# Patient Record
Sex: Male | Born: 1990 | Race: White | Hispanic: No | Marital: Single | Smoking: Current every day smoker
Health system: Southern US, Community
[De-identification: ages and names within clinical notes are randomized; demographics above are authoritative.]

## PROBLEM LIST (undated history)

## (undated) DIAGNOSIS — J45909 Unspecified asthma, uncomplicated: Secondary | ICD-10-CM

## (undated) HISTORY — PX: NO PAST SURGERIES: SHX2092

---

## 2011-02-26 ENCOUNTER — Emergency Department: Payer: Self-pay | Admitting: Emergency Medicine

## 2011-11-20 ENCOUNTER — Ambulatory Visit (INDEPENDENT_AMBULATORY_CARE_PROVIDER_SITE_OTHER): Payer: 59 | Admitting: Physician Assistant

## 2011-11-20 DIAGNOSIS — Z0000 Encounter for general adult medical examination without abnormal findings: Secondary | ICD-10-CM

## 2011-11-20 DIAGNOSIS — Z7251 High risk heterosexual behavior: Secondary | ICD-10-CM

## 2011-11-20 LAB — POCT CBC
Granulocyte percent: 52.4 %G (ref 37–80)
HCT, POC: 48.5 % (ref 43.5–53.7)
MCH, POC: 29.4 pg (ref 27–31.2)
MCV: 90.4 fL (ref 80–97)
MID (cbc): 0.4 (ref 0–0.9)
Platelet Count, POC: 177 10*3/uL (ref 142–424)
RBC: 5.37 M/uL (ref 4.69–6.13)
WBC: 7.3 10*3/uL (ref 4.6–10.2)

## 2011-11-20 LAB — POCT URINALYSIS DIPSTICK
Glucose, UA: NEGATIVE
Nitrite, UA: NEGATIVE
Protein, UA: NEGATIVE
Spec Grav, UA: 1.015
Urobilinogen, UA: 1

## 2011-11-20 NOTE — Patient Instructions (Signed)
Healthy lifestyle 

## 2011-11-20 NOTE — Progress Notes (Signed)
  Subjective:    Patient ID: Drew Brown, male    DOB: 14-Jan-1991, 20 y.o.   MRN: [id number]  HPI Here for CPE.  Needs labs for work insurance.  No problems.  Would like STD testing.   Review of Systems  Constitutional: Negative.   HENT: Negative.   Eyes: Negative.   Respiratory: Negative.   Cardiovascular: Negative.   Gastrointestinal: Negative.   Genitourinary: Negative.   Musculoskeletal: Negative.   Neurological: Negative.   Hematological: Negative.   Psychiatric/Behavioral: Negative.        Objective:   Physical Exam  Constitutional: He is oriented to person, place, and time. He appears well-developed and well-nourished.  HENT:  Head: Normocephalic and atraumatic.  Right Ear: External ear normal.  Left Ear: External ear normal.  Nose: Nose normal.  Mouth/Throat: Oropharynx is clear and moist.  Eyes: Conjunctivae and EOM are normal. Pupils are equal, round, and reactive to light.  Neck: Normal range of motion. Neck supple. No tracheal deviation present. No thyromegaly present.  Cardiovascular: Normal rate, regular rhythm and normal heart sounds.  Exam reveals no gallop and no friction rub.   No murmur heard. Pulmonary/Chest: Effort normal and breath sounds normal. He has no wheezes.  Abdominal: Soft. Bowel sounds are normal. He exhibits no distension. There is no tenderness. Hernia confirmed negative in the right inguinal area and confirmed negative in the left inguinal area.  Genitourinary: Testes normal and penis normal. Circumcised.  Musculoskeletal: Normal range of motion.  Lymphadenopathy:    He has no cervical adenopathy.  Neurological: He is alert and oriented to person, place, and time. He has normal reflexes.  Skin: Skin is warm and dry.  Psychiatric: He has a normal mood and affect. His behavior is normal. Judgment and thought content normal.          Assessment & Plan:   1. Annual physical exam  POCT CBC, Comprehensive metabolic panel, POCT UA -  Microscopic Only, Lipid panel  2. High risk sexual behavior  GC/chlamydia probe amp, urine, HIV antibody, Hepatitis B surface antigen, Hepatitis B surface antibody, Hepatitis C antibody   Form filled out for work.  Will fax form when lipid results are in.

## 2011-11-21 LAB — COMPREHENSIVE METABOLIC PANEL
AST: 47 U/L — ABNORMAL HIGH (ref 0–37)
Albumin: 4.9 g/dL (ref 3.5–5.2)
BUN: 13 mg/dL (ref 6–23)
CO2: 23 mEq/L (ref 19–32)
Calcium: 9.5 mg/dL (ref 8.4–10.5)
Chloride: 104 mEq/L (ref 96–112)
Creat: 0.91 mg/dL (ref 0.50–1.35)
Glucose, Bld: 80 mg/dL (ref 70–99)
Potassium: 4 mEq/L (ref 3.5–5.3)

## 2011-11-21 LAB — HIV ANTIBODY (ROUTINE TESTING W REFLEX): HIV: NONREACTIVE

## 2011-11-21 LAB — HEPATITIS B SURFACE ANTIGEN: Hepatitis B Surface Ag: NEGATIVE

## 2011-11-21 LAB — LIPID PANEL
Cholesterol: 137 mg/dL (ref 0–200)
LDL Cholesterol: 87 mg/dL (ref 0–99)
Total CHOL/HDL Ratio: 4.4 Ratio
VLDL: 19 mg/dL (ref 0–40)

## 2011-11-21 LAB — RPR

## 2011-11-22 LAB — GC/CHLAMYDIA PROBE AMP, URINE
Chlamydia, Swab/Urine, PCR: NEGATIVE
GC Probe Amp, Urine: NEGATIVE

## 2013-03-31 ENCOUNTER — Ambulatory Visit (INDEPENDENT_AMBULATORY_CARE_PROVIDER_SITE_OTHER): Payer: PRIVATE HEALTH INSURANCE | Admitting: Family Medicine

## 2013-03-31 VITALS — BP 126/84 | HR 78 | Temp 98.2°F | Resp 16 | Ht 71.5 in | Wt 282.0 lb

## 2013-03-31 DIAGNOSIS — Z8481 Family history of carrier of genetic disease: Secondary | ICD-10-CM

## 2013-03-31 DIAGNOSIS — Z131 Encounter for screening for diabetes mellitus: Secondary | ICD-10-CM

## 2013-03-31 DIAGNOSIS — Z113 Encounter for screening for infections with a predominantly sexual mode of transmission: Secondary | ICD-10-CM

## 2013-03-31 DIAGNOSIS — Z83438 Family history of other disorder of lipoprotein metabolism and other lipidemia: Secondary | ICD-10-CM

## 2013-03-31 DIAGNOSIS — Z0000 Encounter for general adult medical examination without abnormal findings: Secondary | ICD-10-CM

## 2013-03-31 LAB — LIPID PANEL
Cholesterol: 127 mg/dL (ref 0–200)
HDL: 33 mg/dL — ABNORMAL LOW (ref >39)
LDL Cholesterol: 56 mg/dL (ref 0–99)
Total CHOL/HDL Ratio: 3.8 Ratio
Triglycerides: 190 mg/dL — ABNORMAL HIGH (ref <150)
VLDL: 38 mg/dL (ref 0–40)

## 2013-03-31 LAB — POCT GLYCOSYLATED HEMOGLOBIN (HGB A1C): Hemoglobin A1C: 5

## 2013-03-31 LAB — COMPREHENSIVE METABOLIC PANEL
ALT: 73 U/L — ABNORMAL HIGH (ref 0–53)
AST: 33 U/L (ref 0–37)
Alkaline Phosphatase: 66 U/L (ref 39–117)
CO2: 25 mEq/L (ref 19–32)
Creat: 0.95 mg/dL (ref 0.50–1.35)
Total Bilirubin: 0.5 mg/dL (ref 0.3–1.2)

## 2013-03-31 LAB — POCT CBC
Granulocyte percent: 64.5 %G (ref 37–80)
HCT, POC: 50.2 % (ref 43.5–53.7)
Hemoglobin: 16.4 g/dL (ref 14.1–18.1)
Lymph, poc: 2.1 (ref 0.6–3.4)
MCH, POC: 30.8 pg (ref 27–31.2)
MCHC: 32.7 g/dL (ref 31.8–35.4)
MCV: 94.4 fL (ref 80–97)
MID (cbc): 0.5 (ref 0–0.9)
MPV: 10.3 fL (ref 0–99.8)
POC Granulocyte: 4.8 (ref 2–6.9)
POC LYMPH PERCENT: 28.6 %L (ref 10–50)
POC MID %: 6.9 %M (ref 0–12)
Platelet Count, POC: 188 10*3/uL (ref 142–424)
RBC: 5.32 M/uL (ref 4.69–6.13)
RDW, POC: 12.8 %
WBC: 7.4 10*3/uL (ref 4.6–10.2)

## 2013-03-31 LAB — HIV ANTIBODY (ROUTINE TESTING W REFLEX): HIV: NONREACTIVE

## 2013-03-31 LAB — COMPREHENSIVE METABOLIC PANEL WITH GFR
Albumin: 4.8 g/dL (ref 3.5–5.2)
BUN: 7 mg/dL (ref 6–23)
Calcium: 9.3 mg/dL (ref 8.4–10.5)
Chloride: 105 meq/L (ref 96–112)
Glucose, Bld: 86 mg/dL (ref 70–99)
Potassium: 4.1 meq/L (ref 3.5–5.3)
Sodium: 142 meq/L (ref 135–145)
Total Protein: 6.8 g/dL (ref 6.0–8.3)

## 2013-03-31 LAB — TSH: TSH: 2.16 u[IU]/mL (ref 0.350–4.500)

## 2013-03-31 LAB — RPR
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
## 2013-04-01 LAB — HSV(HERPES SIMPLEX VRS) I + II AB-IGG
HSV 1 Glycoprotein G Ab, IgG: 5.31 IV — ABNORMAL HIGH
HSV 2 Glycoprotein G Ab, IgG: 0.1 IV

## 2013-04-05 ENCOUNTER — Telehealth: Payer: Self-pay

## 2013-04-05 NOTE — Telephone Encounter (Signed)
Pt saw test results on mychart and doesn't understand them. Requests a call to help him understand what they mean.   Bf

## 2013-04-06 ENCOUNTER — Telehealth: Payer: Self-pay | Admitting: Family Medicine

## 2013-04-06 NOTE — Telephone Encounter (Signed)
I spoke with Drew Brown and he wanted to know the results to his lab results. I advised him of what was showing in the system and explained how to use the [facility] access. He was able to view his labs, just did not understand the numbers. I explained to him the results, but also would like for Dr Conley Rolls to look over and see if there is anything she would like for [place] to advise. Also, awaiting G/C results. He understood and will await call back.

## 2013-04-06 NOTE — Telephone Encounter (Signed)
Spoke to pateitn regarding labs. G/C still pending

## 2013-04-07 LAB — GC/CHLAMYDIA PROBE AMP, GENITAL
Chlamydia, DNA Probe: NEGATIVE
GC Probe Amp, Genital: NEGATIVE

## 2013-08-26 ENCOUNTER — Other Ambulatory Visit: Payer: Self-pay

## 2014-09-21 ENCOUNTER — Emergency Department: Payer: Self-pay | Admitting: Emergency Medicine

## 2014-09-21 LAB — COMPREHENSIVE METABOLIC PANEL
ALK PHOS: 106 U/L
ALT: 145 U/L — AB
ANION GAP: 8 (ref 7–16)
AST: 73 U/L — AB (ref 15–37)
Albumin: 3.5 g/dL (ref 3.4–5.0)
BUN: 11 mg/dL (ref 7–18)
Bilirubin,Total: 0.5 mg/dL (ref 0.2–1.0)
CREATININE: 0.97 mg/dL (ref 0.60–1.30)
Calcium, Total: 8.1 mg/dL — ABNORMAL LOW (ref 8.5–10.1)
Chloride: 106 mmol/L (ref 98–107)
Co2: 25 mmol/L (ref 21–32)
EGFR (African American): >60
Glucose: 118 mg/dL — ABNORMAL HIGH (ref 65–99)
Osmolality: 278 (ref 275–301)
Potassium: 3.4 mmol/L — ABNORMAL LOW (ref 3.5–5.1)
SODIUM: 139 mmol/L (ref 136–145)
Total Protein: 6.9 g/dL (ref 6.4–8.2)

## 2014-09-21 LAB — CBC
HCT: 44.4 % (ref 40.0–52.0)
HGB: 14.7 g/dL (ref 13.0–18.0)
MCH: 30.2 pg (ref 26.0–34.0)
MCHC: 33.1 g/dL (ref 32.0–36.0)
MCV: 91 fL (ref 80–100)
Platelet: 173 10*3/uL (ref 150–440)
RBC: 4.87 10*6/uL (ref 4.40–5.90)
RDW: 14.1 % (ref 11.5–14.5)
WBC: 10.3 10*3/uL (ref 3.8–10.6)

## 2014-09-26 ENCOUNTER — Ambulatory Visit: Payer: Self-pay | Admitting: Hematology and Oncology

## 2014-09-26 LAB — COMPREHENSIVE METABOLIC PANEL
ALBUMIN: 3.3 g/dL — AB (ref 3.4–5.0)
AST: 68 U/L — AB (ref 15–37)
Alkaline Phosphatase: 80 U/L
Anion Gap: 9 (ref 7–16)
BILIRUBIN TOTAL: 0.9 mg/dL (ref 0.2–1.0)
BUN: 9 mg/dL (ref 7–18)
CALCIUM: 8.9 mg/dL (ref 8.5–10.1)
Chloride: 105 mmol/L (ref 98–107)
Co2: 27 mmol/L (ref 21–32)
Creatinine: 0.99 mg/dL (ref 0.60–1.30)
EGFR (African American): >60
EGFR (Non-African Amer.): >60
Glucose: 90 mg/dL (ref 65–99)
OSMOLALITY: 279 (ref 275–301)
Potassium: 4.3 mmol/L (ref 3.5–5.1)
SGPT (ALT): 129 U/L — ABNORMAL HIGH
SODIUM: 141 mmol/L (ref 136–145)
Total Protein: 7.1 g/dL (ref 6.4–8.2)

## 2014-09-26 LAB — CBC CANCER CENTER
BASOS PCT: 0.6 %
Basophil #: 0 x10 3/mm (ref 0.0–0.1)
Eosinophil #: 0.3 x10 3/mm (ref 0.0–0.7)
Eosinophil %: 5.1 %
HCT: 41.2 % (ref 40.0–52.0)
HGB: 13.5 g/dL (ref 13.0–18.0)
LYMPHS PCT: 38.7 %
Lymphocyte #: 2.4 x10 3/mm (ref 1.0–3.6)
MCH: 29.8 pg (ref 26.0–34.0)
MCHC: 32.7 g/dL (ref 32.0–36.0)
MCV: 91 fL (ref 80–100)
MONO ABS: 0.6 x10 3/mm (ref 0.2–1.0)
Monocyte %: 10.1 %
NEUTROS ABS: 2.8 x10 3/mm (ref 1.4–6.5)
Neutrophil %: 45.5 %
PLATELETS: 161 x10 3/mm (ref 150–440)
RBC: 4.52 10*6/uL (ref 4.40–5.90)
RDW: 13.9 % (ref 11.5–14.5)
WBC: 6.1 x10 3/mm (ref 3.8–10.6)

## 2014-09-26 LAB — LACTATE DEHYDROGENASE: LDH: 278 U/L — ABNORMAL HIGH (ref 85–241)

## 2014-09-27 LAB — PROT IMMUNOELECTROPHORES(ARMC)

## 2014-10-17 ENCOUNTER — Telehealth: Payer: Self-pay

## 2014-10-17 LAB — IRON AND TIBC
Iron Bind.Cap.(Total): 310 ug/dL (ref 250–450)
Iron Saturation: 25 %
Iron: 76 ug/dL (ref 65–175)
Unbound Iron-Bind.Cap.: 234 ug/dL

## 2014-10-17 LAB — CBC CANCER CENTER
BASOS ABS: 0 x10 3/mm (ref 0.0–0.1)
Basophil %: 0.4 %
EOS ABS: 0.2 x10 3/mm (ref 0.0–0.7)
EOS PCT: 2.8 %
HCT: 45.3 % (ref 40.0–52.0)
HGB: 14.7 g/dL (ref 13.0–18.0)
LYMPHS PCT: 36.2 %
Lymphocyte #: 2.3 x10 3/mm (ref 1.0–3.6)
MCH: 29.4 pg (ref 26.0–34.0)
MCHC: 32.6 g/dL (ref 32.0–36.0)
MCV: 90 fL (ref 80–100)
MONO ABS: 0.5 x10 3/mm (ref 0.2–1.0)
Monocyte %: 8.1 %
Neutrophil #: 3.4 x10 3/mm (ref 1.4–6.5)
Neutrophil %: 52.5 %
PLATELETS: 156 x10 3/mm (ref 150–440)
RBC: 5.02 10*6/uL (ref 4.40–5.90)
RDW: 13.2 % (ref 11.5–14.5)
WBC: 6.5 x10 3/mm (ref 3.8–10.6)

## 2014-10-17 LAB — HEPATIC FUNCTION PANEL A (ARMC)
ALBUMIN: 3.7 g/dL (ref 3.4–5.0)
Alkaline Phosphatase: 93 U/L
Bilirubin, Direct: 0.1 mg/dL (ref 0.0–0.2)
Bilirubin,Total: 0.4 mg/dL (ref 0.2–1.0)
SGOT(AST): 47 U/L — ABNORMAL HIGH (ref 15–37)
SGPT (ALT): 119 U/L — ABNORMAL HIGH
TOTAL PROTEIN: 7.4 g/dL (ref 6.4–8.2)

## 2014-10-17 LAB — FERRITIN: FERRITIN (ARMC): 70 ng/mL (ref 8–388)

## 2014-10-17 NOTE — Telephone Encounter (Signed)
Can you coordinate with her office ([facility]) to make sure her office note from today AND CT scan on DISC is sent for my review.  Thanks

## 2014-10-17 NOTE — Telephone Encounter (Signed)
Records are on your desk for review CT to be mailed tomorrow

## 2014-10-17 NOTE — Telephone Encounter (Signed)
Dr Laurell JosephsBurke with [facility] called about this pt, she will have records faxed for review.  She states she has questions for you regarding an EUS on this pt.  Please call her at [phone]

## 2014-10-17 NOTE — Telephone Encounter (Signed)
Message left with medical records to send all available information by fax and mail the CT on disc

## 2014-10-21 ENCOUNTER — Ambulatory Visit: Payer: Self-pay | Admitting: Hematology and Oncology

## 2014-10-27 ENCOUNTER — Other Ambulatory Visit: Payer: Self-pay

## 2014-10-27 ENCOUNTER — Telehealth: Payer: Self-pay

## 2014-10-27 DIAGNOSIS — R935 Abnormal findings on diagnostic imaging of other abdominal regions, including retroperitoneum: Secondary | ICD-10-CM

## 2014-10-27 NOTE — Telephone Encounter (Signed)
EUS 12/01/14 930  

## 2014-10-27 NOTE — Telephone Encounter (Signed)
EUS 12/01/14 930

## 2014-10-27 NOTE — Telephone Encounter (Signed)
Needs instructions, case amb ref and orders have been done

## 2014-10-28 NOTE — Telephone Encounter (Signed)
EUS scheduled, pt instructed and medications reviewed.  Patient instructions mailed to home.  Patient to call with any questions or concerns.  

## 2014-11-09 LAB — BASIC METABOLIC PANEL
Anion Gap: 9 (ref 7–16)
BUN: 10 mg/dL (ref 7–18)
Calcium, Total: 8.8 mg/dL (ref 8.5–10.1)
Chloride: 105 mmol/L (ref 98–107)
Co2: 28 mmol/L (ref 21–32)
Creatinine: 0.93 mg/dL (ref 0.60–1.30)
EGFR (African American): >60
EGFR (Non-African Amer.): >60
Glucose: 87 mg/dL (ref 65–99)
Osmolality: 282 (ref 275–301)
Potassium: 4.1 mmol/L (ref 3.5–5.1)
Sodium: 142 mmol/L (ref 136–145)

## 2014-11-09 LAB — URIC ACID: Uric Acid: 6.6 mg/dL (ref 3.5–7.2)

## 2014-11-09 LAB — LACTATE DEHYDROGENASE: LDH: 181 U/L (ref 85–241)

## 2014-11-21 ENCOUNTER — Ambulatory Visit: Payer: Self-pay | Admitting: Hematology and Oncology

## 2014-11-22 ENCOUNTER — Encounter (HOSPITAL_COMMUNITY): Payer: Self-pay | Admitting: *Deleted

## 2014-11-30 NOTE — Anesthesia Preprocedure Evaluation (Signed)
Anesthesia Evaluation  Patient identified by MRN, date of birth, ID band Patient awake    Reviewed: Allergy & Precautions, NPO status , Patient's Chart, lab work & pertinent test results  History of Anesthesia Complications Negative for: history of anesthetic complications  Airway Mallampati: II  TM Distance: >3 FB Neck ROM: Full    Dental no notable dental hx. (+) Dental Advisory Given   Pulmonary asthma , Current Smoker,  breath sounds clear to auscultation  Pulmonary exam normal       Cardiovascular negative cardio ROS  Rhythm:Regular Rate:Normal     Neuro/Psych negative neurological ROS  negative psych ROS   GI/Hepatic negative GI ROS, Neg liver ROS,   Endo/Other  obesity  Renal/GU negative Renal ROS  negative genitourinary   Musculoskeletal negative musculoskeletal ROS (+)   Abdominal   Peds negative pediatric ROS (+)  Hematology negative hematology ROS (+)   Anesthesia Other Findings   Reproductive/Obstetrics negative OB ROS                             Anesthesia Physical Anesthesia Plan  ASA: II  Anesthesia Plan: MAC   Post-op Pain Management:    Induction:   Airway Management Planned: Natural Airway and Nasal Cannula  Additional Equipment:   Intra-op Plan:   Post-operative Plan:   Informed Consent: I have reviewed the patients History and Physical, chart, labs and discussed the procedure including the risks, benefits and alternatives for the proposed anesthesia with the patient or authorized representative who has indicated his/her understanding and acceptance.   Dental advisory given  Plan Discussed with: CRNA  Anesthesia Plan Comments:         Anesthesia Quick Evaluation

## 2014-12-01 ENCOUNTER — Encounter (HOSPITAL_COMMUNITY)
Admission: RE | Disposition: A | Discharge status: Home / Self Care | Payer: Self-pay | Source: Ambulatory Visit | Attending: Gastroenterology

## 2014-12-01 ENCOUNTER — Ambulatory Visit (HOSPITAL_COMMUNITY): Payer: BLUE CROSS/BLUE SHIELD | Admitting: Anesthesiology

## 2014-12-01 ENCOUNTER — Encounter (HOSPITAL_COMMUNITY): Payer: Self-pay | Admitting: *Deleted

## 2014-12-01 ENCOUNTER — Ambulatory Visit (HOSPITAL_COMMUNITY)
Admission: RE | Admit: 2014-12-01 | Discharge: 2014-12-01 | Disposition: A | Discharge status: Home / Self Care | Payer: BLUE CROSS/BLUE SHIELD | Source: Ambulatory Visit | Attending: Gastroenterology | Admitting: Gastroenterology

## 2014-12-01 DIAGNOSIS — Z6839 Body mass index (BMI) 39.0-39.9, adult: Secondary | ICD-10-CM | POA: Diagnosis not present

## 2014-12-01 DIAGNOSIS — Z79899 Other long term (current) drug therapy: Secondary | ICD-10-CM | POA: Diagnosis not present

## 2014-12-01 DIAGNOSIS — K209 Esophagitis, unspecified: Secondary | ICD-10-CM

## 2014-12-01 DIAGNOSIS — J45909 Unspecified asthma, uncomplicated: Secondary | ICD-10-CM | POA: Diagnosis not present

## 2014-12-01 DIAGNOSIS — F17210 Nicotine dependence, cigarettes, uncomplicated: Secondary | ICD-10-CM | POA: Diagnosis not present

## 2014-12-01 DIAGNOSIS — K2210 Ulcer of esophagus without bleeding: Secondary | ICD-10-CM | POA: Insufficient documentation

## 2014-12-01 DIAGNOSIS — K929 Disease of digestive system, unspecified: Secondary | ICD-10-CM

## 2014-12-01 DIAGNOSIS — R590 Localized enlarged lymph nodes: Secondary | ICD-10-CM | POA: Insufficient documentation

## 2014-12-01 DIAGNOSIS — E669 Obesity, unspecified: Secondary | ICD-10-CM | POA: Diagnosis not present

## 2014-12-01 DIAGNOSIS — R935 Abnormal findings on diagnostic imaging of other abdominal regions, including retroperitoneum: Secondary | ICD-10-CM

## 2014-12-01 DIAGNOSIS — R161 Splenomegaly, not elsewhere classified: Secondary | ICD-10-CM | POA: Insufficient documentation

## 2014-12-01 HISTORY — DX: Unspecified asthma, uncomplicated: J45.909

## 2014-12-01 HISTORY — PX: EUS: SHX5427

## 2014-12-01 SURGERY — UPPER ENDOSCOPIC ULTRASOUND (EUS) LINEAR
Anesthesia: Monitor Anesthesia Care

## 2014-12-01 MED ORDER — PROPOFOL 10 MG/ML IV BOLUS
INTRAVENOUS | Status: DC | PRN
  Administered 2014-12-01 (×12): 50 mg via INTRAVENOUS
  Administered 2014-12-01: 100 mg via INTRAVENOUS
  Administered 2014-12-01: 50 mg via INTRAVENOUS

## 2014-12-01 MED ORDER — PROPOFOL 10 MG/ML IV BOLUS
INTRAVENOUS | Status: AC
  Filled 2014-12-01: qty 20

## 2014-12-01 MED ORDER — LACTATED RINGERS IV SOLN
INTRAVENOUS | Status: DC | PRN
  Administered 2014-12-01: 09:00:00 via INTRAVENOUS

## 2014-12-01 MED ORDER — OMEPRAZOLE 20 MG PO CPDR: 20.0000 mg | DELAYED_RELEASE_CAPSULE | Freq: Every day | ORAL | Status: AC

## 2014-12-01 MED ORDER — LACTATED RINGERS IV SOLN
INTRAVENOUS | Status: DC
  Administered 2014-12-01: 1000 mL via INTRAVENOUS

## 2014-12-01 MED ORDER — SODIUM CHLORIDE 0.9 % IV SOLN: INTRAVENOUS | Status: DC

## 2014-12-01 NOTE — Transfer of Care (Signed)
Immediate Anesthesia Transfer of Care Note  Patient: Drew Brown  Procedure(s) Performed: Procedure(s): UPPER ENDOSCOPIC ULTRASOUND (EUS) LINEAR (N/A)  Patient Location: PACU  Anesthesia Type:MAC  Level of Consciousness: awake, sedated and patient cooperative  Airway & Oxygen Therapy: Patient Spontanous Breathing and Patient connected to face mask oxygen  Post-op Assessment: Report given to RN and Post -op Vital signs reviewed and stable  Post vital signs: Reviewed and stable  Last Vitals:  Filed Vitals:   12/01/14 0853  BP: 129/81  Pulse: 78  Temp: 36.8 C  Resp: 16    Complications: No apparent anesthesia complications

## 2014-12-01 NOTE — Discharge Instructions (Signed)
Gastrointestinal Endoscopy, Care After °Refer to this sheet in the next few weeks. These instructions provide you with information on caring for yourself after your procedure. Your caregiver may also give you more specific instructions. Your treatment has been planned according to current medical practices, but problems sometimes occur. Call your caregiver if you have any problems or questions after your procedure. °HOME CARE INSTRUCTIONS °· If you were given medicine to help you relax (sedative), do not drive, operate machinery, or sign important documents for 24 hours. °· Avoid alcohol and hot or warm beverages for the first 24 hours after the procedure. °· Only take over-the-counter or prescription medicines for pain, discomfort, or fever as directed by your caregiver. You may resume taking your normal medicines unless your caregiver tells you otherwise. Ask your caregiver when you may resume taking medicines that may cause bleeding, such as aspirin, clopidogrel, or warfarin. °· You may return to your normal diet and activities on the day after your procedure, or as directed by your caregiver. Walking may help to reduce any bloated feeling in your abdomen. °· Drink enough fluids to keep your urine clear or pale yellow. °· You may gargle with salt water if you have a sore throat. °SEEK IMMEDIATE MEDICAL CARE IF: °· You have severe nausea or vomiting. °· You have severe abdominal pain, abdominal cramps that last longer than 6 hours, or abdominal swelling (distention). °· You have severe shoulder or back pain. °· You have trouble swallowing. °· You have shortness of breath, your breathing is shallow, or you are breathing faster than normal. °· You have a fever or a rapid heartbeat. °· You vomit blood or material that looks like coffee grounds. °· You have bloody, black, or tarry stools. °MAKE SURE YOU: °· Understand these instructions. °· Will watch your condition. °· Will get help right away if you are not doing  well or get worse. °Document Released: 05/21/2004 Document Revised: 02/21/2014 Document Reviewed: 01/07/2012 °ExitCare® Patient Information ©2015 [employer]. This information is not intended to replace advice given to you by your health care provider. Make sure you discuss any questions you have with your health care provider. ° °

## 2014-12-01 NOTE — Anesthesia Postprocedure Evaluation (Signed)
  Anesthesia Post-op Note  Patient: Drew Brown  Procedure(s) Performed: Procedure(s) (LRB): UPPER ENDOSCOPIC ULTRASOUND (EUS) LINEAR (N/A)  Patient Location: PACU  Anesthesia Type: MAC  Level of Consciousness: awake and alert   Airway and Oxygen Therapy: Patient Spontanous Breathing  Post-op Pain: mild  Post-op Assessment: Post-op Vital signs reviewed, Patient's Cardiovascular Status Stable, Respiratory Function Stable, Patent Airway and No signs of Nausea or vomiting  Last Vitals:  Filed Vitals:   12/01/14 1023  BP: 110/59  Pulse:   Temp:   Resp:     Post-op Vital Signs: stable   Complications: No apparent anesthesia complications

## 2014-12-01 NOTE — Op Note (Signed)
[facility] [address] [place], [id number]   ENDOSCOPIC ULTRASOUND PROCEDURE REPORT  PATIENT: Drew Brown, Drew Brown  MR#: [id number] BIRTHDATE: 11-19-1990  GENDER: male ENDOSCOPIST: Rachael Feeaniel P Jacobs, MD REFERRED BY:  Dr[facility] Laurell [facility]ke at [facility] PROCEDURE DATE:  12/01/2014 PROCEDURE:   Upper EUS w/FNA, EGD with biopsy ASA CLASS:      Class II INDICATIONS:   1.  incidentally noted adenopathy (located in abd, just below GE junction). MEDICATIONS: Monitored anesthesia care  DESCRIPTION OF PROCEDURE:   After the risks benefits and alternatives of the procedure were  explained, informed consent was obtained. The patient was then placed in the left, lateral, decubitus postion and IV sedation was administered. Throughout the procedure, the patients blood pressure, pulse and oxygen saturations were monitored continuously.  Under direct visualization, the Pentax Radial EUS [id number]  endoscope was introduced through the mouth  and advanced to the second portion of the duodenum .  Water was used as necessary to provide an acoustic interface.  Upon completion of the imaging, water was removed and the patient was sent to the recovery room in satisfactory condition.   Endoscopic findings (with echoendoscopes and standard adult gastroscope): 1. Mild erosive esophagitis at the GE junction. Given nearby adenopathy, this was biopsied mucosally and sent to pathology. 2. UGI tract was otherwise normal.  EUS findings: 1. Adenopathy located just distal to the GE junction, below the diaphragm. There were two visible nodes, the largest was 2cm across, round, discrete margins, homogeneous, hyopechoic. This lymphnode was sampled with three transgastric passes of a 22 gauge EUS FNA needle. 2. Esophagus and stomach showed normal echolayering (no masses) 3. Limited views of pancreas, liver, spleen, portal and splenic vessels were all normal.  ENDOSCOPIC IMPRESSION: Mild  erosive esophagitis at the GE junction; this was biopsied. The nearby adenopathy was sampled with FNA, preliminary cytology review suggests reactive adenopathy.  Await final cytology, flow cytometry, pathology results.  I will start him on once daily omeprazole (20mg  pill, OTC), best taken 20-30 min prior to breakfast meal for erosive esophagitis.  RECOMMENDATIONS: Await final testing above  _______________________________ eSigned:  Rachael Feeaniel P Jacobs, MD 12/01/2014 10:28 AM
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
## 2014-12-02 ENCOUNTER — Encounter (HOSPITAL_COMMUNITY): Payer: Self-pay | Admitting: Gastroenterology
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
## 2020-06-06 ENCOUNTER — Other Ambulatory Visit: Payer: Self-pay

## 2020-06-06 ENCOUNTER — Telehealth: Payer: Self-pay

## 2020-06-06 ENCOUNTER — Ambulatory Visit
Admission: RE | Admit: 2020-06-06 | Discharge: 2020-06-06 | Disposition: A | Discharge status: Home / Self Care | Payer: 59 | Source: Ambulatory Visit | Attending: Family Medicine | Admitting: Family Medicine

## 2020-06-06 VITALS — BP 126/87 | HR 87 | Temp 99.0°F | Resp 19

## 2020-06-06 DIAGNOSIS — J209 Acute bronchitis, unspecified: Secondary | ICD-10-CM | POA: Diagnosis not present

## 2020-06-06 DIAGNOSIS — R062 Wheezing: Secondary | ICD-10-CM

## 2020-06-06 DIAGNOSIS — R0602 Shortness of breath: Secondary | ICD-10-CM

## 2020-06-06 DIAGNOSIS — B349 Viral infection, unspecified: Secondary | ICD-10-CM

## 2020-06-06 DIAGNOSIS — Z1152 Encounter for screening for COVID-19: Secondary | ICD-10-CM | POA: Diagnosis not present

## 2020-06-06 DIAGNOSIS — R059 Cough, unspecified: Secondary | ICD-10-CM

## 2020-06-06 DIAGNOSIS — R05 Cough: Secondary | ICD-10-CM | POA: Diagnosis not present

## 2020-06-06 LAB — POCT RAPID STREP A (OFFICE): Rapid Strep A Screen: NEGATIVE

## 2020-06-06 MED ORDER — IPRATROPIUM-ALBUTEROL 0.5-2.5 (3) MG/3ML IN SOLN: 3.0000 mL | RESPIRATORY_TRACT | 0 refills | Status: AC | PRN

## 2020-06-06 MED ORDER — METHYLPREDNISOLONE SODIUM SUCC 125 MG IJ SOLR
125.0000 mg | Freq: Once | INTRAMUSCULAR | Status: AC
  Administered 2020-06-06: 125 mg via INTRAMUSCULAR

## 2020-06-06 MED ORDER — FLUTICASONE-SALMETEROL 250-50 MCG/DOSE IN AEPB: 1.0000 | INHALATION_SPRAY | Freq: Two times a day (BID) | RESPIRATORY_TRACT | 0 refills | Status: AC

## 2020-06-06 MED ORDER — ALBUTEROL SULFATE HFA 108 (90 BASE) MCG/ACT IN AERS
2.0000 | INHALATION_SPRAY | Freq: Once | RESPIRATORY_TRACT | Status: AC
  Administered 2020-06-06: 2 via RESPIRATORY_TRACT

## 2020-06-06 MED ORDER — PREDNISONE 10 MG (21) PO TBPK: ORAL_TABLET | Freq: Every day | ORAL | 0 refills | Status: AC

## 2020-06-06 MED ORDER — AEROCHAMBER PLUS FLO-VU LARGE MISC
1.0000 | Freq: Once | Status: AC
  Administered 2020-06-06: 1

## 2020-06-06 NOTE — Discharge Instructions (Addendum)
I have sent in a steroid taper for you to take as well. Take 6 tablets for the first two days, take 5 tablets for day three and four, take 4 tablets for days five and six, take 3 tablets for days seven and eight, take 2 tablets for day nine and ten, then take 1 tablet for days eleven and twelve.  Albuterol inhaler 2 puffs every 4 hours as needed for wheezing  Duoneb solution in nebulizer every 4 hours for the next 24 hours, then every 4 hours as needed  You received a steroid injection in the office today  Advair inhaler one puff twice a day (steroid inhaler)  Your COVID test is pending.  You should self quarantine until the test result is back.    Take Tylenol as needed for fever or discomfort.  Rest and keep yourself hydrated.    Go to the emergency department if you develop acute worsening symptoms.

## 2020-06-06 NOTE — ED Provider Notes (Signed)
[facility]   [id number] 06/06/20 Arrival Time: 1214   CC: COVID symptoms  SUBJECTIVE: History from: patient.  Drew Brown is a 29 y.o. male who presents with abrupt onset of nasal congestion, PND, wheezing and persistent dry cough for the last 3 days . Denies sick exposure to COVID, flu or strep. Denies recent travel. Has positive history of Covid last year sometime, cannot recall when. Is a current daily smoker. Reports hx of childhood asthma. Has not completed Covid vaccines. Has not taken OTC medications for this. There are no aggravating or alleviating factors. Denies previous symptoms in the past. Denies fever, chills, fatigue, sinus pain, rhinorrhea, sore throat, SOB, wheezing, chest pain, nausea, changes in bowel or bladder habits.    ROS: As per HPI.  All other pertinent ROS negative.     Past Medical History:  Diagnosis Date  . Asthma    Past Surgical History:  Procedure Laterality Date  . EUS N/A 12/01/2014   Procedure: UPPER ENDOSCOPIC ULTRASOUND (EUS) LINEAR;  Surgeon: Rachael Fee, MD;  Location: [facility];  Service: Endoscopy;  Laterality: N/A;  . NO PAST SURGERIES     No Known Allergies No current facility-administered medications on file prior to encounter.   Current Outpatient Medications on File Prior to Encounter  Medication Sig Dispense Refill  . budesonide-formoterol (SYMBICORT) 160-4.5 MCG/ACT inhaler Inhale 2 puffs into the lungs 2 (two) times daily.    . calcium carbonate (OS-CAL) 600 MG TABS tablet Take 600 mg by mouth 2 (two) times daily.    . Calcium-Vitamins C & D (CALCIUM/C/D PO) Take by mouth.    . ergocalciferol (VITAMIN D2) 1.25 MG (50000 UT) capsule Take by mouth.    Marland Kitchen omeprazole (PRILOSEC) 20 MG capsule Take 1 capsule (20 mg total) by mouth daily before breakfast.    . Vitamin D, Ergocalciferol, (DRISDOL) 50000 UNITS CAPS capsule Take 50,000 Units by mouth every 7 (seven) days. Wed.    . [DISCONTINUED] albuterol (PROVENTIL  HFA;VENTOLIN HFA) 108 (90 BASE) MCG/ACT inhaler Inhale 2 puffs into the lungs every 6 (six) hours as needed for wheezing or shortness of breath.     Social History   Socioeconomic History  . Marital status: Single    Spouse name: Not on file  . Number of children: Not on file  . Years of education: Not on file  . Highest education level: Not on file  Occupational History  . Not on file  Tobacco Use  . Smoking status: Current Every Day Smoker    Packs/day: 0.50    Years: 2.00    Pack years: 1.00    Types: Cigarettes  . Smokeless tobacco: Current User    Types: Snuff  Substance and Sexual Activity  . Alcohol use: No  . Drug use: No  . Sexual activity: Yes    Birth control/protection: None  Other Topics Concern  . Not on file  Social History Narrative  . Not on file   Social Determinants of Health   Financial Resource Strain:   . Difficulty of Paying Living Expenses:   Food Insecurity:   . Worried About Programme researcher, broadcasting/film/video in the Last Year:   . Barista in the Last Year:   Transportation Needs:   . Freight forwarder (Medical):   Marland Kitchen Lack of Transportation (Non-Medical):   Physical Activity:   . Days of Exercise per Week:   . Minutes of Exercise per Session:   Stress:   .  Feeling of Stress :   Social Connections:   . Frequency of Communication with Friends and Family:   . Frequency of Social Gatherings with Friends and Family:   . Attends Religious Services:   . Active Member of Clubs or Organizations:   . Attends Banker Meetings:   Marland Kitchen Marital Status:   Intimate Partner Violence:   . Fear of Current or Ex-Partner:   . Emotionally Abused:   Marland Kitchen Physically Abused:   . Sexually Abused:    Family History  Problem Relation Age of Onset  . Diabetes Father   . Stroke Father   . Healthy Mother   . Heart disease Maternal Grandfather   . Diabetes Paternal Grandfather     OBJECTIVE:  Vitals:   06/06/20 1228  BP: 126/87  Pulse: 87  Resp:  19  Temp: 99 F (37.2 C)  TempSrc: Oral  SpO2: 93%     General appearance: alert; appears fatigued, but nontoxic; speaking in full sentences and tolerating own secretions HEENT: NCAT; Ears: EACs clear, TMs pearly gray; Eyes: PERRL.  EOM grossly intact. Sinuses: nontender; Nose: nares patent without rhinorrhea, Throat: oropharynx clear, tonsils non erythematous or enlarged, uvula midline  Neck: supple without LAD Lungs: unlabored respirations, symmetrical air entry; cough: moderate; no respiratory distress; very tight wheezing to bilateral bases, diffuse wheezing noted throughout lung fields Heart: regular rate and rhythm.  Radial pulses 2+ symmetrical bilaterally Skin: warm and dry Psychological: alert and cooperative; normal mood and affect  LABS:  Results for orders placed or performed during the hospital encounter of 06/06/20 (from the past 24 hour(s))  POCT rapid strep A     Status: None   Collection Time: 06/06/20 12:30 PM  Result Value Ref Range   Rapid Strep A Screen Negative Negative     ASSESSMENT & PLAN:  1. Acute bronchitis, unspecified organism   2. Viral illness   3. Encounter for screening for COVID-19   4. Cough   5. SOB (shortness of breath)   6. Wheezing     Meds ordered this encounter  Medications  . methylPREDNISolone sodium succinate (SOLU-MEDROL) 125 mg/2 mL injection 125 mg  . albuterol (VENTOLIN HFA) 108 (90 Base) MCG/ACT inhaler 2 puff  . AeroChamber Plus Flo-Vu Large MISC 1 each  . predniSONE (STERAPRED UNI-PAK 21 TAB) 10 MG (21) TBPK tablet    Sig: Take by mouth daily. Take 6 tabs by mouth daily  for 2 days, then 5 tabs for 2 days, then 4 tabs for 2 days, then 3 tabs for 2 days, 2 tabs for 2 days, then 1 tab by mouth daily for 2 days    Dispense:  42 tablet    Refill:  0    Order Specific Question:   Supervising Provider    Answer:   Merrilee Jansky [id number]  . ipratropium-albuterol (DUONEB) 0.5-2.5 (3) MG/3ML SOLN    Sig: Take 3 mLs by  nebulization every 4 (four) hours as needed.    Dispense:  360 mL    Refill:  0    Order Specific Question:   Supervising Provider    Answer:   Merrilee Jansky [id number]  . Fluticasone-Salmeterol (ADVAIR DISKUS) 250-50 MCG/DOSE AEPB    Sig: Inhale 1 puff into the lungs 2 (two) times daily.    Dispense:  60 each    Refill:  0    Order Specific Question:   Supervising Provider    Answer:   Merrilee Jansky [id number]  Solumedrol 125mg  IM in office today Albuterol inhaler and spacer given in office today Incentive spirometry in office Prescribed prednisone taper Prescribed duoneb, has neb machine at home Prescribed Advair  Lung sounds CTAB after solumedrol and albuterol in office  COVID testing ordered.  It will take between 1-2 days for test results.  Someone will contact you regarding abnormal results.    Patient should remain in quarantine until they have received Covid results.  If negative you may resume normal activities (go back to work/school) while practicing hand hygiene, social distance, and mask wearing.  If positive, patient should remain in quarantine for 10 days from symptom onset AND greater than 72 hours after symptoms resolution (absence of fever without the use of fever-reducing medication and improvement in respiratory symptoms), whichever is longer Get plenty of rest and push fluids Use OTC zyrtec for nasal congestion, runny nose, and/or sore throat Use OTC flonase for nasal congestion and runny nose Use medications daily for symptom relief Use OTC medications like ibuprofen or tylenol as needed fever or pain Call or go to the ED if you have any new or worsening symptoms such as fever, worsening cough, shortness of breath, chest tightness, chest pain, turning blue, changes in mental status.  Reviewed expectations re: course of current medical issues. Questions answered. Outlined signs and symptoms indicating need for more acute intervention. Patient verbalized  understanding. After Visit Summary given.               , NP 06/06/20 1348

## 2020-06-06 NOTE — ED Triage Notes (Signed)
Pt is here with cough, congestion, sore throat that started yesterday, pt has taken Cold & Flu meds to relieve discomfort.

## 2020-06-07 ENCOUNTER — Other Ambulatory Visit (HOSPITAL_COMMUNITY)
Admission: AD | Admit: 2020-06-07 | Discharge: 2020-06-07 | Disposition: A | Discharge status: Home / Self Care | Payer: 59 | Source: Ambulatory Visit | Attending: Family Medicine | Admitting: Family Medicine

## 2020-06-07 DIAGNOSIS — B349 Viral infection, unspecified: Secondary | ICD-10-CM | POA: Insufficient documentation

## 2020-06-08 LAB — NOVEL CORONAVIRUS, NAA: SARS-CoV-2, NAA: NOT DETECTED

## 2020-06-08 LAB — SARS-COV-2, NAA 2 DAY TAT

## 2020-06-10 LAB — CULTURE, GROUP A STREP (THRC)
